# Patient Record
Sex: Male | Born: 1990 | Race: White | Hispanic: No | Marital: Married | Smoking: Never smoker
Health system: Southern US, Community
[De-identification: ages and names within clinical notes are randomized; demographics above are authoritative.]

---

## 2014-09-06 ENCOUNTER — Ambulatory Visit
Admission: EM | Admit: 2014-09-06 | Discharge: 2014-09-06 | Disposition: A | Discharge status: Home / Self Care | Payer: Managed Care, Other (non HMO) | Attending: Family Medicine | Admitting: Family Medicine

## 2014-09-06 ENCOUNTER — Encounter: Payer: Self-pay | Admitting: Emergency Medicine

## 2014-09-06 ENCOUNTER — Ambulatory Visit: Payer: Managed Care, Other (non HMO)

## 2014-09-06 DIAGNOSIS — R05 Cough: Secondary | ICD-10-CM | POA: Diagnosis present

## 2014-09-06 DIAGNOSIS — J209 Acute bronchitis, unspecified: Secondary | ICD-10-CM | POA: Diagnosis not present

## 2014-09-06 DIAGNOSIS — H6593 Unspecified nonsuppurative otitis media, bilateral: Secondary | ICD-10-CM | POA: Diagnosis not present

## 2014-09-06 DIAGNOSIS — R0981 Nasal congestion: Secondary | ICD-10-CM | POA: Diagnosis present

## 2014-09-06 DIAGNOSIS — J019 Acute sinusitis, unspecified: Secondary | ICD-10-CM | POA: Insufficient documentation

## 2014-09-06 MED ORDER — PSEUDOEPHEDRINE HCL 30 MG PO TABS: 30.0000 mg | ORAL_TABLET | ORAL | Status: AC | PRN

## 2014-09-06 MED ORDER — IPRATROPIUM-ALBUTEROL 0.5-2.5 (3) MG/3ML IN SOLN
3.0000 mL | Freq: Once | RESPIRATORY_TRACT | Status: AC
  Administered 2014-09-06: 3 mL via RESPIRATORY_TRACT

## 2014-09-06 MED ORDER — LORATADINE 10 MG PO TABS: 10.0000 mg | ORAL_TABLET | Freq: Every day | ORAL | Status: AC

## 2014-09-06 MED ORDER — PREDNISONE 20 MG PO TABS: 40.0000 mg | ORAL_TABLET | Freq: Every day | ORAL | Status: AC

## 2014-09-06 MED ORDER — ALBUTEROL SULFATE HFA 108 (90 BASE) MCG/ACT IN AERS: 1.0000 | INHALATION_SPRAY | RESPIRATORY_TRACT | Status: AC | PRN

## 2014-09-06 MED ORDER — AZITHROMYCIN 250 MG PO TABS: 250.0000 mg | ORAL_TABLET | Freq: Every day | ORAL | Status: AC

## 2014-09-06 MED ORDER — BENZONATATE 200 MG PO CAPS
200.0000 mg | ORAL_CAPSULE | Freq: Three times a day (TID) | ORAL | Status: AC | PRN
Start: 2014-09-06 — End: ?

## 2014-09-06 NOTE — ED Provider Notes (Signed)
CSN: [id number]     Arrival date & time 09/06/14  1003 History   First MD Initiated Contact with Patient 09/06/14 1034     Chief Complaint  Patient presents with  . Cough  . 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  Stated exposed to mold in camp buildings past 3 months.  Forehead headache last couple of days, chest congestion, nasal congestion;  Cough interrupting sleep;  Denied hemoptysis, vomiting with cough, fever, chills, sweats, sick contact exposure  Patient is a 24 y.o. male presenting with cough. The history is provided by the patient.  Cough Cough characteristics:  Productive Sputum characteristics:  Green Severity:  Moderate Onset quality:  Gradual Duration:  1 week Timing:  Constant Progression:  Worsening Chronicity:  New Smoker: no   Context: exposure to allergens, upper respiratory infection, weather changes and with activity   Context: not animal exposure, not fumes, not occupational exposure, not sick contacts and not smoke exposure   Relieved by:  Nothing Worsened by:  Deep breathing, environmental changes and activity Ineffective treatments:  None tried Associated symptoms: headaches, sinus congestion and wheezing   Associated symptoms: no chest pain, no chills, no diaphoresis, no ear fullness, no ear pain, no eye discharge, no fever, no myalgias, no rash, no rhinorrhea, no shortness of breath, no sore throat and no weight loss   Headaches:    Severity:  Mild   Onset quality:  Sudden   Duration:  2 days   Timing:  Intermittent   Progression:  Unchanged   Chronicity:  New Wheezing:    Severity:  Moderate   Onset quality:  Sudden   Duration:  3 days   Timing:  Constant   Progression:  Worsening  Chronicity:  New Risk factors: recent infection and recent travel   Risk factors: no chemical exposure     History reviewed. No pertinent past medical history. History reviewed. No pertinent past surgical history. History reviewed. No pertinent family history. Social History  Substance Use Topics  . Smoking status: Never Smoker   . Smokeless tobacco: None  . Alcohol Use: Yes    Review of Systems  Constitutional: Negative for fever, chills, weight loss, diaphoresis, activity change, appetite change and fatigue.  HENT: Positive for congestion. Negative for dental problem, drooling, ear discharge, ear pain, facial swelling, hearing loss, mouth sores, nosebleeds, postnasal drip, rhinorrhea, sinus pressure, sneezing, sore throat, tinnitus, trouble swallowing and voice change.   Eyes: Negative for photophobia, pain, discharge, redness, itching and visual disturbance.  Respiratory: Positive for cough, chest tightness and wheezing. Negative for choking and shortness of breath.   Cardiovascular: Negative for chest pain, palpitations and leg swelling.  Gastrointestinal: Negative for nausea, vomiting, abdominal pain, diarrhea, constipation, blood in stool, abdominal distention and anal bleeding.  Endocrine: Negative for cold intolerance and heat intolerance.  Genitourinary: Negative for dysuria and hematuria.  Musculoskeletal: Negative for myalgias, back pain and gait problem.  Skin: Negative for color change, pallor, rash and wound.  Allergic/Immunologic: Positive for environmental allergies. Negative for food allergies.  Neurological: Positive for headaches. Negative for dizziness, tremors, seizures, syncope, facial asymmetry, speech difficulty, weakness, light-headedness and numbness.  Hematological: Negative for adenopathy. Does not bruise/bleed easily.  Psychiatric/Behavioral: Positive for sleep disturbance. Negative for behavioral problems, confusion and agitation.    [id number]  Dust mite  extract  Home Medications   Prior to Admission medications   Medication Sig Start Date End Date Taking? Authorizing Provider  albuterol (PROVENTIL HFA;VENTOLIN HFA) 108 (90 BASE) MCG/ACT inhaler Inhale 1-2 puffs into the lungs every 4 (four) hours as needed for wheezing or shortness of breath. 09/06/14   Barbaraann Barthel, NP  azithromycin (ZITHROMAX) 250 MG tablet Take 1 tablet (250 mg total) by mouth daily. Take first 2 tablets together, then 1 every day until finished. 09/06/14   Barbaraann Barthel, NP  benzonatate (TESSALON) 200 MG capsule Take 1 capsule (200 mg total) by mouth 3 (three) times daily as needed for cough. 09/06/14   Barbaraann Barthel, NP  loratadine (CLARITIN) 10 MG tablet Take 1 tablet (10 mg total) by mouth daily. 09/06/14   Barbaraann Barthel, NP  predniSONE (DELTASONE) 20 MG tablet Take 2 tablets (40 mg total) by mouth daily with breakfast. 09/06/14   Barbaraann Barthel, NP  pseudoephedrine (SUDAFED) 30 MG tablet Take 1 tablet (30 mg total) by mouth every 4 (four) hours as needed for congestion. Max 8 tabs per 24 hours 09/06/14   Jarold Song Betancourt, NP   BP 138/70 mmHg  Pulse 59  Temp(Src) 98 F (36.7 C) (Oral)  Resp 16  SpO2 100% Physical Exam  Constitutional: He is oriented to person, place, and time. Vital signs are normal. He appears well-developed and well-nourished. No distress.  HENT:  Head: Normocephalic and atraumatic.  Right Ear: Hearing, external ear and ear canal normal. A middle ear effusion is present.  Left Ear: Hearing, external ear and ear canal normal. A middle ear effusion is present.  Nose: Mucosal edema and rhinorrhea present. Right sinus exhibits no maxillary sinus tenderness and no frontal sinus tenderness. Left sinus exhibits no maxillary sinus tenderness and no frontal sinus tenderness.  Mouth/Throat: Oropharynx is clear and moist. No oropharyngeal exudate.  Eyes: Conjunctivae, EOM and lids are normal. Pupils are equal, round, and reactive to light.  Right eye exhibits no discharge. Left eye exhibits no discharge. No scleral icterus.  Neck: Trachea normal and normal range of motion. Neck supple. No tracheal deviation present. No thyromegaly present.  Cardiovascular: Normal rate, regular rhythm, normal heart sounds and intact distal pulses.  Exam reveals no gallop and no friction rub.   No murmur heard. Pulmonary/Chest: Effort normal. No accessory muscle usage or stridor. No respiratory distress. He has decreased breath sounds in the right upper field, the right middle field, the right lower field, the left upper field, the left middle field and the left lower field. He has wheezes in the right upper field, the right middle field, the right lower field, the left upper field, the left middle field and the left lower field. He has rhonchi in the right middle field, the right lower field, the left middle field and the left lower field. He has rales in the right middle field, the right lower field, the left middle field and the left lower field. He exhibits no tenderness.  Negative egophany all lung fields  Abdominal: Soft. He exhibits no distension.  Musculoskeletal: Normal range of motion. He exhibits no edema or tenderness.  Lymphadenopathy:    He has no cervical adenopathy.  Neurological: He is alert and oriented to person, place, and time. He exhibits normal muscle tone. Coordination normal.  Skin: Skin is warm, dry and intact. No rash noted. He is not diaphoretic. No erythema. No pallor.  Psychiatric: He has a normal mood and  affect. His speech is normal and behavior is normal. Judgment and thought content normal. Cognition and memory are normal.  Nursing note and vitals reviewed.   ED Course  Procedures (including critical care time) Labs Review Labs Reviewed - No data to display  Imaging Review Dg Chest 2 View  09/06/2014   CLINICAL DATA:  Rhonchi and wheezing  EXAM: CHEST  2 VIEW  COMPARISON:  None.  FINDINGS: The lungs are clear. The  heart size and pulmonary vascularity are normal. No adenopathy. No bone lesions.  IMPRESSION: No abnormality noted.   Electronically Signed   By: Bretta Bang III M.D.   On: 09/06/2014 11:16   1130 patient notified of chest xray results negative for fluid or pneumonia.  Sp02 99% room air HR 72.  Feels better after nebulizer but still wheezing.  Decreased cough frequency. Bilateral lower lung fields still with rhonchi.  improved airflow throughout on re-evaluation.  Patient verbalized understanding of information/instructions, agreed with plan of care and had no further questions at this time. Medications  ipratropium-albuterol (DUONEB) 0.5-2.5 (3) MG/3ML nebulizer solution 3 mL (3 mLs Nebulization Given 09/06/14 1050)  given by RN Blima Ledger  MDM   1. Acute rhinosinusitis   2. Otitis media with effusion, bilateral   3. Acute bronchitis, unspecified organism   continue claritin 10mg  po daily. Azithromycin Rx given.  May use nasal saline 2 sprays each nostril q2h prn congestion.  No evidence of systemic bacterial infection, non toxic and well hydrated.  I do not see where any further testing or imaging is necessary at this time.   I will suggest supportive care, rest, good hygiene and encourage the patient to take adequate fluids.  The patient is to return to clinic or EMERGENCY ROOM if symptoms worsen or change significantly.  Exitcare handout on sinusitis given to patient.  Patient verbalized agreement and understanding of treatment plan and had no further questions at this time.   P2:  Hand washing and cover cough  Patient may use normal saline nasal spray as needed. Continue claritin 10mg  po daily.  If still having breakthrough rhinitis start sudafed 30mg  po q4-6h prn max 8 tabs per 24 hours.  Consider nasal steroid use.  Avoid triggers if possible.  Shower prior to bedtime if exposed to triggers.  If allergic dust/dust mites recommend mattress/pillow covers/encasements; washing linens,  vacuuming, sweeping, dusting weekly.  Call or return to clinic as needed if these symptoms worsen or fail to improve as anticipated.   Exitcare handout on allergic rhinitis given to patient.  Patient verbalized understanding of instructions, agreed with plan of care and had no further questions at this time.  P2:  Avoidance and hand washing.  Supportive treatment.   No evidence of invasive bacterial infection, non toxic and well hydrated.  This is most likely self limiting viral infection.  I do not see where any further testing or imaging is necessary at this time.   I will suggest supportive care, rest, good hygiene and encourage the patient to take adequate fluids.  The patient is to return to clinic or EMERGENCY ROOM if symptoms worsen or change significantly e.g. ear pain, fever, purulent discharge from ears or bleeding.  Exitcare handout on otitis media with effusion given to patient.  Patient verbalized agreement and understanding of treatment plan.    Prednisone burst 40mg  po daily for inflammation x 5 days.  Albuterol inhaler 1-2 puffs q4-6h prn wheezing, protracted cough.  Azithromycin 500mg  po today and 250mg  po  daily x 4 days thereafter.  Rx given.  Tessalon pearles 200mg  po TID prn cough.  Bronchitis simple, community acquired, may have started as viral (probably respiratory syncytial, parainfluenza, influenza, or adenovirus), but now evidence of acute purulent bronchitis with resultant bronchial edema and mucus formation.  Differential Diagnoses:  Reactive Airway Disease (asthma, allergic aspergillosis eosinophilia), chronic bronchitis, respiratory infection (sinusitis, common cold, pneumonia), congestive heart failure, smoke/irritant exposure, reflux esophagitis, bronchogenic tumor, and/or aspiration syndromes.  I will give Zithromax for five days for possible Mycoplasma.  Without high fever, severe dyspnea and lack of physical findings or risk factors will hold on chest radiograph and CBC at  this time.  I discussed that approximately 50% of patients with acute bronchitis have a cough that lasts up to three weeks, and 25% for over a month. Tylenol, one to two tablets every four hours as needed for fever or myalgias.   No aspirin. Patient instructed to follow up in one week or sooner if symptoms worsen. Patient verbalized agreement and understanding of treatment plan.  P2:  hand washing and cover cough    Barbaraann Barthel, NP 09/06/14 1140

## 2014-09-06 NOTE — Discharge Instructions (Signed)
Acute Bronchitis Bronchitis is inflammation of the airways that extend from the windpipe into the lungs (bronchi). The inflammation often causes mucus to develop. This leads to a cough, which is the most common symptom of bronchitis.  In acute bronchitis, the condition usually develops suddenly and goes away over time, usually in a couple weeks. Smoking, allergies, and asthma can make bronchitis worse. Repeated episodes of bronchitis may cause further lung problems.  CAUSES Acute bronchitis is most often caused by the same virus that causes a cold. The virus can spread from person to person (contagious) through coughing, sneezing, and touching contaminated objects. SIGNS AND SYMPTOMS   Cough.   Fever.   Coughing up mucus.   Body aches.   Chest congestion.   Chills.   Shortness of breath.   Sore throat.  DIAGNOSIS  Acute bronchitis is usually diagnosed through a physical exam. Your health care provider will also ask you questions about your medical history. Tests, such as chest X-rays, are sometimes done to rule out other conditions.  TREATMENT  Acute bronchitis usually goes away in a couple weeks. Oftentimes, no medical treatment is necessary. Medicines are sometimes given for relief of fever or cough. Antibiotic medicines are usually not needed but may be prescribed in certain situations. In some cases, an inhaler may be recommended to help reduce shortness of breath and control the cough. A cool mist vaporizer may also be used to help thin bronchial secretions and make it easier to clear the chest.  HOME CARE INSTRUCTIONS  Get plenty of rest.   Drink enough fluids to keep your urine clear or pale yellow (unless you have a medical condition that requires fluid restriction). Increasing fluids may help thin your respiratory secretions (sputum) and reduce chest congestion, and it will prevent dehydration.   Take medicines only as directed by your health care provider.  If  you were prescribed an antibiotic medicine, finish it all even if you start to feel better.  Avoid smoking and secondhand smoke. Exposure to cigarette smoke or irritating chemicals will make bronchitis worse. If you are a smoker, consider using nicotine gum or skin patches to help control withdrawal symptoms. Quitting smoking will help your lungs heal faster.   Reduce the chances of another bout of acute bronchitis by washing your hands frequently, avoiding people with cold symptoms, and trying not to touch your hands to your mouth, nose, or eyes.   Keep all follow-up visits as directed by your health care provider.  SEEK MEDICAL CARE IF: Your symptoms do not improve after 1 week of treatment.  SEEK IMMEDIATE MEDICAL CARE IF:  You develop an increased fever or chills.   You have chest pain.   You have severe shortness of breath.  You have bloody sputum.   You develop dehydration.  You faint or repeatedly feel like you are going to pass out.  You develop repeated vomiting.  You develop a severe headache. MAKE SURE YOU:   Understand these instructions.  Will watch your condition.  Will get help right away if you are not doing well or get worse. Document Released: 02/16/2004 Document Revised: 05/25/2013 Document Reviewed: 07/01/2012 [facility] Patient Information 2015 [place], [place]. This information is not intended to replace advice given to you by your health care provider. Make sure you discuss any questions you have with your health care provider. Otitis Media With Effusion Otitis media with effusion is the presence of fluid in the middle ear. This is a common problem in children,  which often follows ear infections. It may be present for weeks or longer after the infection. Unlike an acute ear infection, otitis media with effusion refers only to fluid behind the ear drum and not infection. Children with repeated ear and sinus infections and allergy problems are the most  likely to get otitis media with effusion. CAUSES  The most frequent cause of the fluid buildup is dysfunction of the eustachian tubes. These are the tubes that drain fluid in the ears to the back of the nose (nasopharynx). SYMPTOMS   The main symptom of this condition is hearing loss. As a result, you or your child may:  Listen to the TV at a loud volume.  Not respond to questions.  Ask "what" often when spoken to.  Mistake or confuse one sound or word for another.  There may be a sensation of fullness or pressure but usually not pain. DIAGNOSIS   Your health care provider will diagnose this condition by examining you or your child's ears.  Your health care provider may test the pressure in you or your child's ear with a tympanometer.  A hearing test may be conducted if the problem persists. TREATMENT   Treatment depends on the duration and the effects of the effusion.  Antibiotics, decongestants, nose drops, and cortisone-type drugs (tablets or nasal spray) may not be helpful.  Children with persistent ear effusions may have delayed language or behavioral problems. Children at risk for developmental delays in hearing, learning, and speech may require referral to a specialist earlier than children not at risk.  You or your child's health care provider may suggest a referral to an ear, nose, and throat surgeon for treatment. The following may help restore normal hearing:  Drainage of fluid.  Placement of ear tubes (tympanostomy tubes).  Removal of adenoids (adenoidectomy). HOME CARE INSTRUCTIONS   Avoid secondhand smoke.  Infants who are breastfed are less likely to have this condition.  Avoid feeding infants while they are lying flat.  Avoid known environmental allergens.  Avoid people who are sick. SEEK MEDICAL CARE IF:   Hearing is not better in 3 months.  Hearing is worse.  Ear pain.  Drainage from the ear.  Dizziness. MAKE SURE YOU:   Understand these  instructions.  Will watch your condition.  Will get help right away if you are not doing well or get worse. Document Released: 02/16/2004 Document Revised: 05/25/2013 Document Reviewed: 08/05/2012 [facility] Patient Information 2015 [place], [place]. This information is not intended to replace advice given to you by your health care provider. Make sure you discuss any questions you have with your health care provider. Sinusitis Sinusitis is redness, soreness, and inflammation of the paranasal sinuses. Paranasal sinuses are air pockets within the bones of your face (beneath the eyes, the middle of the forehead, or above the eyes). In healthy paranasal sinuses, mucus is able to drain out, and air is able to circulate through them by way of your nose. However, when your paranasal sinuses are inflamed, mucus and air can become trapped. This can allow bacteria and other germs to grow and cause infection. Sinusitis can develop quickly and last only a short time (acute) or continue over a long period (chronic). Sinusitis that lasts for more than 12 weeks is considered chronic.  CAUSES  Causes of sinusitis include:  Allergies.  Structural abnormalities, such as displacement of the cartilage that separates your nostrils (deviated septum), which can decrease the air flow through your nose and sinuses and affect  sinus drainage.  Functional abnormalities, such as when the small hairs (cilia) that line your sinuses and help remove mucus do not work properly or are not present. SIGNS AND SYMPTOMS  Symptoms of acute and chronic sinusitis are the same. The primary symptoms are pain and pressure around the affected sinuses. Other symptoms include:  Upper toothache.  Earache.  Headache.  Bad breath.  Decreased sense of smell and taste.  A cough, which worsens when you are lying flat.  Fatigue.  Fever.  Thick drainage from your nose, which often is green and may contain pus (purulent).  Swelling and  warmth over the affected sinuses. DIAGNOSIS  Your health care provider will perform a physical exam. During the exam, your health care provider may:  Look in your nose for signs of abnormal growths in your nostrils (nasal polyps).  Tap over the affected sinus to check for signs of infection.  View the inside of your sinuses (endoscopy) using an imaging device that has a light attached (endoscope). If your health care provider suspects that you have chronic sinusitis, one or more of the following tests may be recommended:  Allergy tests.  Nasal culture. A sample of mucus is taken from your nose, sent to a lab, and screened for bacteria.  Nasal cytology. A sample of mucus is taken from your nose and examined by your health care provider to determine if your sinusitis is related to an allergy. TREATMENT  Most cases of acute sinusitis are related to a viral infection and will resolve on their own within 10 days. Sometimes medicines are prescribed to help relieve symptoms (pain medicine, decongestants, nasal steroid sprays, or saline sprays).  However, for sinusitis related to a bacterial infection, your health care provider will prescribe antibiotic medicines. These are medicines that will help kill the bacteria causing the infection.  Rarely, sinusitis is caused by a fungal infection. In theses cases, your health care provider will prescribe antifungal medicine. For some cases of chronic sinusitis, surgery is needed. Generally, these are cases in which sinusitis recurs more than 3 times per year, despite other treatments. HOME CARE INSTRUCTIONS   Drink plenty of water. Water helps thin the mucus so your sinuses can drain more easily.  Use a humidifier.  Inhale steam 3 to 4 times a day (for example, sit in the bathroom with the shower running).  Apply a warm, moist washcloth to your face 3 to 4 times a day, or as directed by your health care provider.  Use saline nasal sprays to help  moisten and clean your sinuses.  Take medicines only as directed by your health care provider.  If you were prescribed either an antibiotic or antifungal medicine, finish it all even if you start to feel better. SEEK IMMEDIATE MEDICAL CARE IF:  You have increasing pain or severe headaches.  You have nausea, vomiting, or drowsiness.  You have swelling around your face.  You have vision problems.  You have a stiff neck.  You have difficulty breathing. MAKE SURE YOU:   Understand these instructions.  Will watch your condition.  Will get help right away if you are not doing well or get worse. Document Released: 01/08/2005 Document Revised: 05/25/2013 Document Reviewed: 01/23/2011 [facility] Patient Information 2015 [place], [place]. This information is not intended to replace advice given to you by your health care provider. Make sure you discuss any questions you have with your health care provider. Allergic Rhinitis Allergic rhinitis is when the mucous membranes in the nose  respond to allergens. Allergens are particles in the air that cause your body to have an allergic reaction. This causes you to release allergic antibodies. Through a chain of events, these eventually cause you to release histamine into the blood stream. Although meant to protect the body, it is this release of histamine that causes your discomfort, such as frequent sneezing, congestion, and an itchy, runny nose.  CAUSES  Seasonal allergic rhinitis (hay fever) is caused by pollen allergens that may come from grasses, trees, and weeds. Year-round allergic rhinitis (perennial allergic rhinitis) is caused by allergens such as house dust mites, pet dander, and mold spores.  SYMPTOMS   Nasal stuffiness (congestion).  Itchy, runny nose with sneezing and tearing of the eyes. DIAGNOSIS  Your health care provider can help you determine the allergen or allergens that trigger your symptoms. If you and your health care provider  are unable to determine the allergen, skin or blood testing may be used. TREATMENT  Allergic rhinitis does not have a cure, but it can be controlled by:  Medicines and allergy shots (immunotherapy).  Avoiding the allergen. Hay fever may often be treated with antihistamines in pill or nasal spray forms. Antihistamines block the effects of histamine. There are over-the-counter medicines that may help with nasal congestion and swelling around the eyes. Check with your health care provider before taking or giving this medicine.  If avoiding the allergen or the medicine prescribed do not work, there are many new medicines your health care provider can prescribe. Stronger medicine may be used if initial measures are ineffective. Desensitizing injections can be used if medicine and avoidance does not work. Desensitization is when a patient is given ongoing shots until the body becomes less sensitive to the allergen. Make sure you follow up with your health care provider if problems continue. HOME CARE INSTRUCTIONS It is not possible to completely avoid allergens, but you can reduce your symptoms by taking steps to limit your exposure to them. It helps to know exactly what you are allergic to so that you can avoid your specific triggers. SEEK MEDICAL CARE IF:   You have a fever.  You develop a cough that does not stop easily (persistent).  You have shortness of breath.  You start wheezing.  Symptoms interfere with normal daily activities. Document Released: 10/03/2000 Document Revised: 01/13/2013 Document Reviewed: 09/15/2012 [facility] Patient Information 2015 [place], [place]. This information is not intended to replace advice given to you by your health care provider. Make sure you discuss any questions you have with your health care provider. Metered Dose Inhaler (No Spacer Used) Inhaled medicines are the basis of treatment for asthma and other breathing problems. Inhaled medicine can only be  effective if used properly. Good technique assures that the medicine reaches the lungs. Metered dose inhalers (MDIs) are used to deliver a variety of inhaled medicines. These include quick relief or rescue medicines (such as bronchodilators) and controller medicines (such as corticosteroids). The medicine is delivered by pushing down on a metal canister to release a set amount of spray. If you are using different kinds of inhalers, use your quick relief medicine to open the airways 10-15 minutes before using a steroid, if instructed to do so by your health care provider. If you are unsure which inhalers to use and the order of using them, ask your health care provider, nurse, or respiratory therapist. HOW TO USE THE INHALER  Remove the cap from the inhaler.  If you are using the inhaler for  the first time, you will need to prime it. Shake the inhaler for 5 seconds and release four puffs into the air, away from your face. Ask your health care provider or pharmacist if you have questions about priming your inhaler.  Shake the inhaler for 5 seconds before each breath in (inhalation).  Position the inhaler so that the top of the canister faces up.  Put your index finger on the top of the medicine canister. Your thumb supports the bottom of the inhaler.  Open your mouth.  Either place the inhaler between your teeth and place your lips tightly around the mouthpiece, or hold the inhaler 1-2 inches away from your open mouth. If you are unsure of which technique to use, ask your health care provider.  Breathe out (exhale) normally and as completely as possible.  Press the canister down with the index finger to release the medicine.  At the same time as the canister is pressed, inhale deeply and slowly until your lungs are completely filled. This should take 4-6 seconds. Keep your tongue down.  Hold the medicine in your lungs for 5-10 seconds (10 seconds is best). This helps the medicine get into the  small airways of your lungs.  Breathe out slowly, through pursed lips. Whistling is an example of pursed lips.  Wait at least 1 minute between puffs. Continue with the above steps until you have taken the number of puffs your health care provider has ordered. Do not use the inhaler more than your health care provider directs you to.  Replace the cap on the inhaler.  Follow the directions from your health care provider or the inhaler insert for cleaning the inhaler. If you are using a steroid inhaler, after your last puff, rinse your mouth with water, gargle, and spit out the water. Do not swallow the water. AVOID:  Inhaling before or after starting the spray of medicine. It takes practice to coordinate your breathing with triggering the spray.  Inhaling through the nose (rather than the mouth) when triggering the spray. HOW TO DETERMINE IF YOUR INHALER IS FULL OR NEARLY EMPTY You cannot know when an inhaler is empty by shaking it. Some inhalers are now being made with dose counters. Ask your health care provider for a prescription that has a dose counter if you feel you need that extra help. If your inhaler does not have a counter, ask your health care provider to help you determine the date you need to refill your inhaler. Write the refill date on a calendar or your inhaler canister. Refill your inhaler 7-10 days before it runs out. Be sure to keep an adequate supply of medicine. This includes making sure it has not expired, and making sure you have a spare inhaler. SEEK MEDICAL CARE IF:  Symptoms are only partially relieved with your inhaler.  You are having trouble using your inhaler.  You experience an increase in phlegm. SEEK IMMEDIATE MEDICAL CARE IF:  You feel little or no relief with your inhalers. You are still wheezing and feeling shortness of breath, tightness in your chest, or both.  You have dizziness, headaches, or a fast heart rate.  You have chills, fever, or night  sweats.  There is a noticeable increase in phlegm production, or there is blood in the phlegm. MAKE SURE YOU:  Understand these instructions.  Will watch your condition.  Will get help right away if you are not doing well or get worse. Document Released: 11/05/2006 Document Revised: 05/25/2013 Document  Reviewed: 06/26/2012 [employer] Patient Information 2015 [place], [place]. This information is not intended to replace advice given to you by your health care provider. Make sure you discuss any questions you have with your health care provider.

## 2014-09-06 NOTE — ED Notes (Signed)
Pt states that he has been dealing with cough and congestion since last monday

## 2016-05-06 IMAGING — CR DG CHEST 2V
2 series · 2 of 2 positions shown · non-contrast
Comparison: None.

CLINICAL DATA: Rhonchi and wheezing

EXAM:
CHEST  2 VIEW

[chest pa]
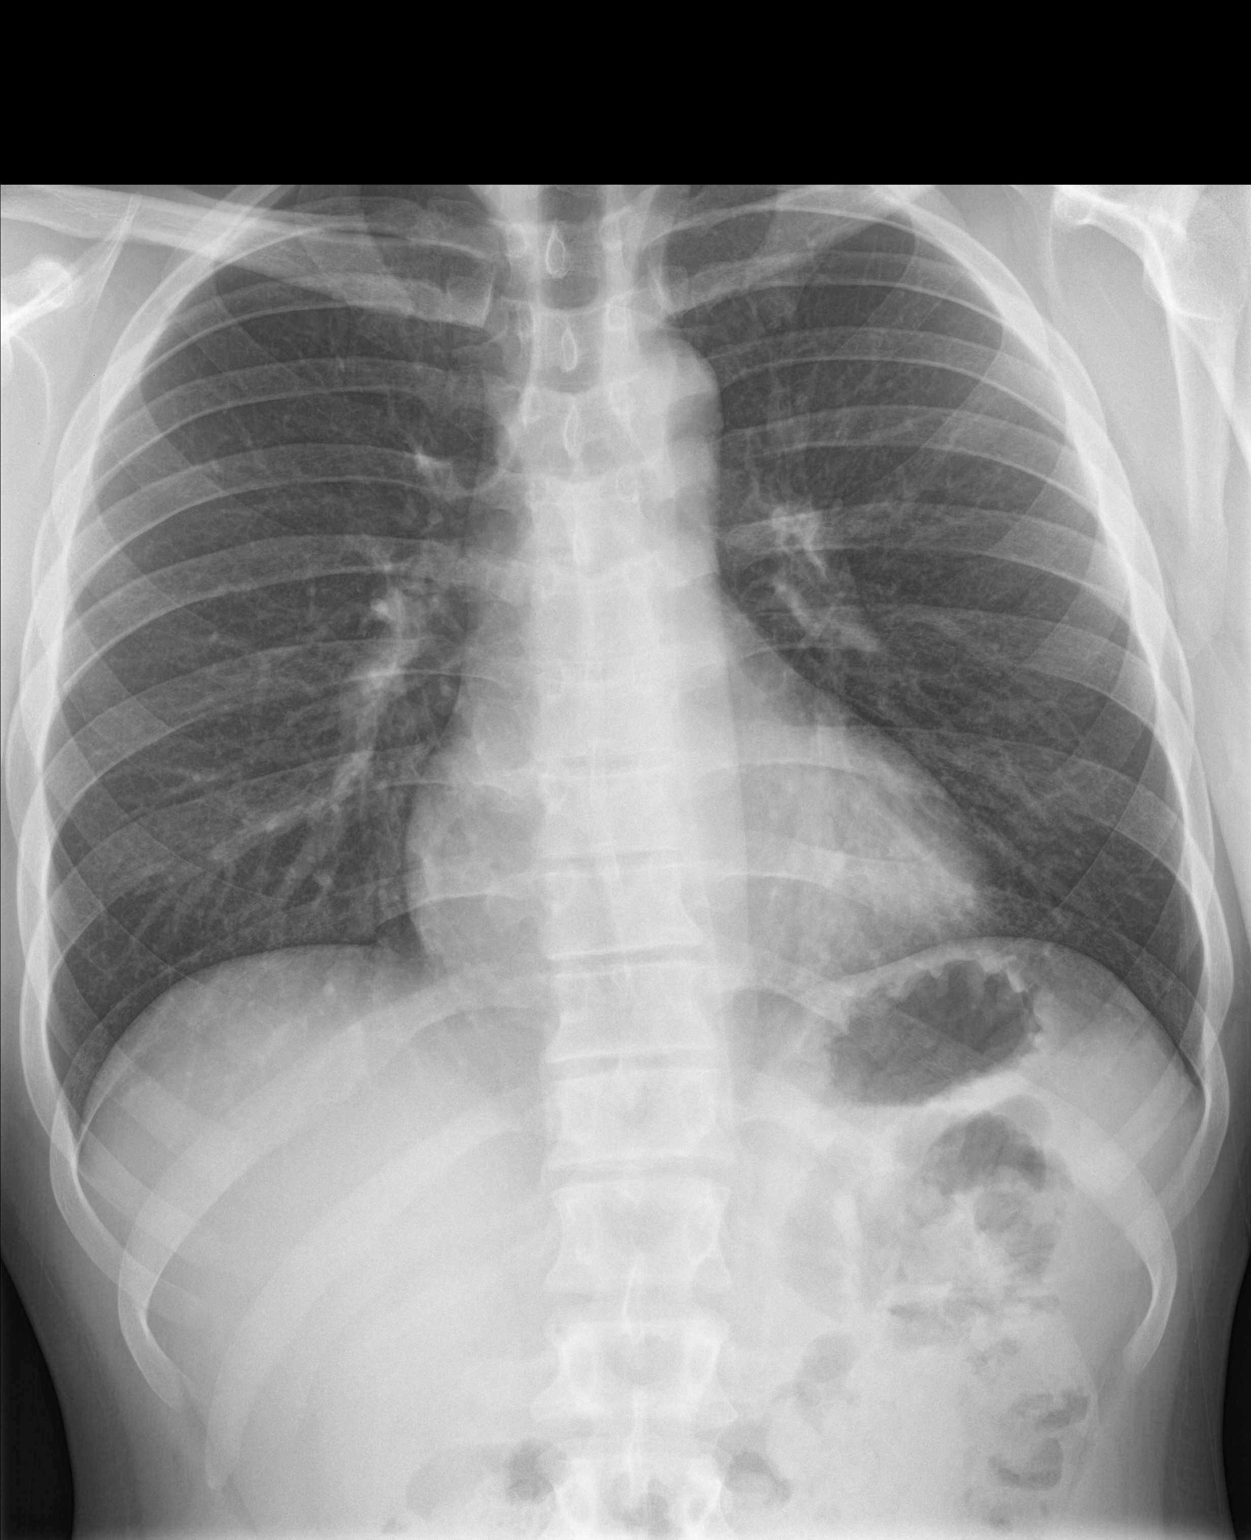

[chest lat]
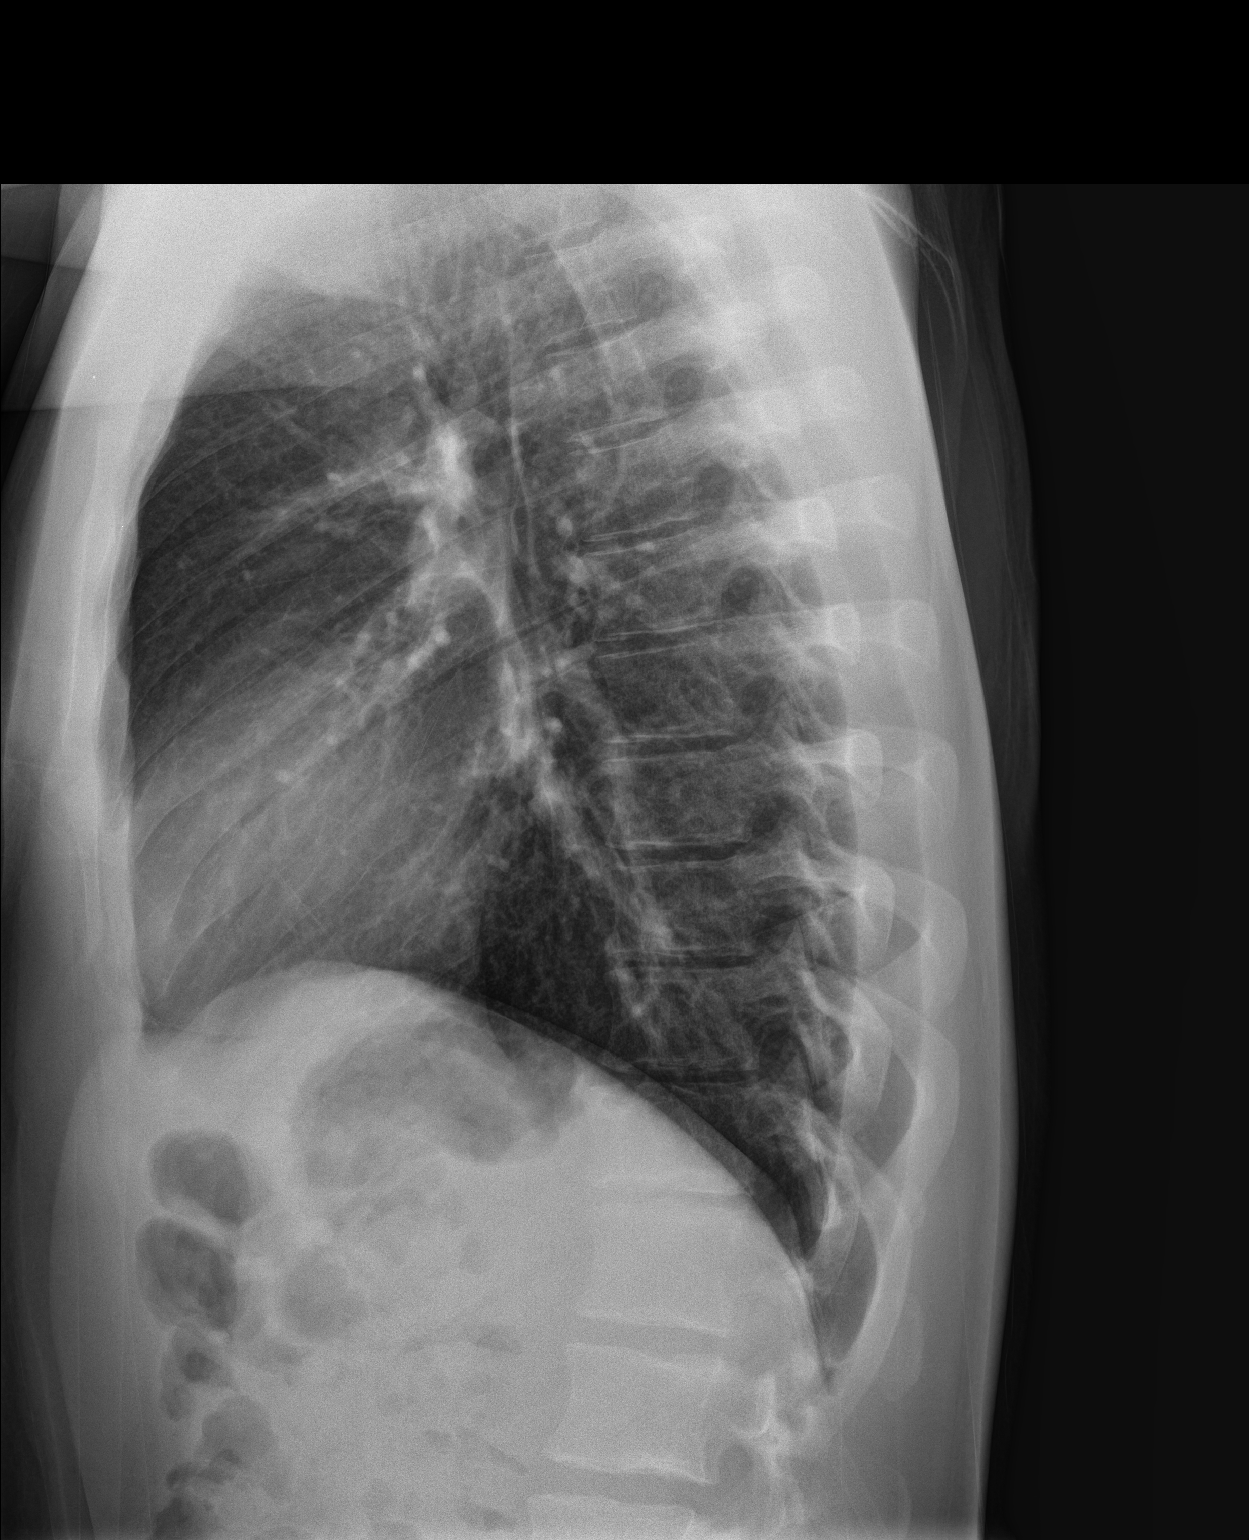

[2 of 2 positions shown; findings below may reference images not displayed]

FINDINGS: The lungs are clear. The heart size and pulmonary vascularity are
normal. No adenopathy. No bone lesions.
IMPRESSION: No abnormality noted.
